# Patient Record
Sex: Male | Born: 2000 | Race: Asian | Hispanic: No | Marital: Single | Smoking: Never smoker
Health system: Southern US, Community
[De-identification: ages and names within clinical notes are randomized; demographics above are authoritative.]

---
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
## 2023-06-15 ENCOUNTER — Other Ambulatory Visit: Payer: Self-pay | Admitting: Family Medicine

## 2023-06-15 ENCOUNTER — Ambulatory Visit: Payer: Self-pay

## 2023-06-15 DIAGNOSIS — Z0000 Encounter for general adult medical examination without abnormal findings: Secondary | ICD-10-CM
